# Patient Record
Sex: Female | Born: 2000 | Race: Black or African American | Hispanic: No | Marital: Single | Smoking: Never smoker
Health system: Southern US, Community
[De-identification: ages and names within clinical notes are randomized; demographics above are authoritative.]

## PROBLEM LIST (undated history)

## (undated) DIAGNOSIS — F909 Attention-deficit hyperactivity disorder, unspecified type: Secondary | ICD-10-CM

## (undated) DIAGNOSIS — F419 Anxiety disorder, unspecified: Secondary | ICD-10-CM

## (undated) HISTORY — PX: NO PAST SURGERIES: SHX2092

---
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
## 2020-03-16 ENCOUNTER — Emergency Department (HOSPITAL_COMMUNITY)
Admission: EM | Admit: 2020-03-16 | Discharge: 2020-03-16 | Disposition: A | Discharge status: Home / Self Care | Payer: BC Managed Care – PPO | Attending: Emergency Medicine | Admitting: Emergency Medicine

## 2020-03-16 ENCOUNTER — Encounter (HOSPITAL_COMMUNITY): Payer: Self-pay

## 2020-03-16 ENCOUNTER — Other Ambulatory Visit: Payer: Self-pay

## 2020-03-16 DIAGNOSIS — R1031 Right lower quadrant pain: Secondary | ICD-10-CM | POA: Diagnosis present

## 2020-03-16 DIAGNOSIS — Z5321 Procedure and treatment not carried out due to patient leaving prior to being seen by health care provider: Secondary | ICD-10-CM | POA: Insufficient documentation

## 2020-03-16 NOTE — ED Triage Notes (Signed)
Patient arrived with a sharp pain on her right flank over the last week. Reports managing with otc medication. Declines any other symptoms at this time.

## 2020-03-19 ENCOUNTER — Emergency Department (HOSPITAL_COMMUNITY): Payer: BC Managed Care – PPO

## 2020-03-19 ENCOUNTER — Encounter (HOSPITAL_COMMUNITY): Payer: Self-pay | Admitting: *Deleted

## 2020-03-19 ENCOUNTER — Other Ambulatory Visit: Payer: Self-pay

## 2020-03-19 ENCOUNTER — Ambulatory Visit (HOSPITAL_COMMUNITY): Admit: 2020-03-19 | Discharge status: Against Medical Advice | Payer: No Typology Code available for payment source

## 2020-03-19 DIAGNOSIS — F909 Attention-deficit hyperactivity disorder, unspecified type: Secondary | ICD-10-CM | POA: Insufficient documentation

## 2020-03-19 DIAGNOSIS — R0781 Pleurodynia: Secondary | ICD-10-CM | POA: Insufficient documentation

## 2020-03-19 DIAGNOSIS — Z79899 Other long term (current) drug therapy: Secondary | ICD-10-CM | POA: Diagnosis not present

## 2020-03-19 NOTE — ED Triage Notes (Signed)
Pt reports sharp pain under the right lateral ribs that radiates to the right upper chest. Pain with deep inspiration. Pt denies cough or fevers. Taking OTC meds without relief.

## 2020-03-20 ENCOUNTER — Emergency Department (HOSPITAL_COMMUNITY)
Admission: EM | Admit: 2020-03-20 | Discharge: 2020-03-20 | Disposition: A | Discharge status: Home / Self Care | Payer: BC Managed Care – PPO | Attending: Emergency Medicine | Admitting: Emergency Medicine

## 2020-03-20 DIAGNOSIS — R0781 Pleurodynia: Secondary | ICD-10-CM

## 2020-03-20 HISTORY — DX: Anxiety disorder, unspecified: F41.9

## 2020-03-20 HISTORY — DX: Attention-deficit hyperactivity disorder, unspecified type: F90.9

## 2020-03-20 MED ORDER — METHOCARBAMOL 500 MG PO TABS: 500.0000 mg | ORAL_TABLET | Freq: Two times a day (BID) | ORAL | 0 refills | Status: DC

## 2020-03-20 MED ORDER — LIDOCAINE 5 % EX PTCH: 1.0000 | MEDICATED_PATCH | CUTANEOUS | 0 refills | Status: AC

## 2020-03-20 NOTE — Discharge Instructions (Addendum)
At this time there does not appear to be the presence of an emergent medical condition, however there is always the potential for conditions to change. Please read and follow the below instructions.  Please return to the Emergency Department immediately for any new or worsening symptoms. Please be sure to follow up with your Primary Care Provider within one week regarding your visit today; please call their office to schedule an appointment even if you are feeling better for a follow-up visit. You may use the muscle relaxer Robaxin as prescribed to help with your symptoms.  Do not drive or operate heavy machinery while taking Robaxin as it will make you drowsy.  Do not drink alcohol or take other sedating medications while taking Robaxin as this will worsen side effects. Please take Ibuprofen (Advil, motrin) and Tylenol (acetaminophen) to relieve your pain.  You may take up to 400 MG (2 pills) of normal strength ibuprofen every 8 hours as needed.  In between doses of ibuprofen you make take tylenol, up to 500 mg (one extra strength pill).  Do not take more than 3,000 mg tylenol in a 24 hour period.  Please check all medication labels as many medications such as pain and cold medications may contain tylenol.  Do not drink alcohol while taking these medications.  Do not take other NSAID'S while taking ibuprofen (such as aleve or naproxen).  Please take ibuprofen with food to decrease stomach upset. You may use the Lidoderm patch as prescribed to help with your symptoms.  Lidoderm may be expensive so you may speak with your pharmacist about finding over-the-counter medications that work similarly such as Salon patches.  Get help right away if: Your chest pain is worse. You have a cough that gets worse, or you cough up blood. You have very bad (severe) pain in your belly (abdomen). You pass out (faint). You have either of these for no clear reason: Sudden chest discomfort. Sudden discomfort in your arms,  back, neck, or jaw. You have shortness of breath at any time. You suddenly start to sweat, or your skin gets clammy. You feel sick to your stomach (nauseous). You throw up (vomit). You suddenly feel lightheaded or dizzy. You feel very weak or tired. Your heart starts to beat fast, or it feels like it is skipping beats. You have any new/concerning or worsening of symptoms These symptoms may be an emergency. Do not wait to see if the symptoms will go away. Get medical help right away. Call your local emergency services (911 in the [place] Do not drive yourself to the hospital.  Please read the additional information packets attached to your discharge summary.  Do not take your medicine if  develop an itchy rash, swelling in your mouth or lips, or difficulty breathing; call 911 and seek immediate emergency medical attention if this occurs.  You may review your lab tests and imaging results in their entirety on your MyChart account.  Please discuss all results of fully with your primary care provider and other specialist at your follow-up visit.  Note: Portions of this text may have been transcribed using voice recognition software. Every effort was made to ensure accuracy; however, inadvertent computerized transcription errors may still be present.

## 2020-03-20 NOTE — ED Provider Notes (Signed)
[facility]-EMERGENCY DEPT Provider Note   CSN: [id number] Arrival date & time: 03/19/20  2224     History Chief Complaint  Patient presents with  . Rib Pain    Audrey Allen is a 19 y.o. female otherwise healthy, medications include Vyvanse and Nexplanon implant.  Patient presents today for right lower rib pain onset 2 weeks ago pain is sharp intermittent no clear inciting event, pain is occasionally worsened with movement and occasionally with deep breathing, pain improved with putting pressure on her lower ribs with her hand, pain currently mild intensity, when pain is more severe it will radiate up to her upper right ribs.  She denies fall/injury, fever/chills, headache, cough/hemoptysis, shortness of breath, abdominal pain, nausea/vomiting, extremity swelling/color change, history of blood clot, recent immobilization/surgery, history of cancer or any additional concerns.  Pain is not exacerbated with eating.  HPI     Past Medical History:  Diagnosis Date  . ADHD   . Anxiety     There are no problems to display for this patient.   No past surgical history on file.   OB History   No obstetric history on file.     No family history on file.  Social History   Tobacco Use  . Smoking status: Never Smoker  . Smokeless tobacco: Never Used  Substance Use Topics  . Alcohol use: Never  . Drug use: Never    Home Medications Prior to Admission medications   Medication Sig Start Date End Date Taking? Authorizing Provider  lidocaine (LIDODERM) 5 % Place 1 patch onto the skin daily. Remove & Discard patch within 12 hours or as directed by MD 03/20/20   Bill Salinas, PA-C  methocarbamol (ROBAXIN) 500 MG tablet Take 1 tablet (500 mg total) by mouth 2 (two) times daily. 03/20/20   Bill Salinas, PA-C    Allergies    Patient has no known allergies.  Review of Systems   Review of Systems  Constitutional: Negative.  Negative for chills and  fever.  Respiratory: Negative.  Negative for cough and shortness of breath.   Cardiovascular: Positive for chest pain. Negative for leg swelling.  Gastrointestinal: Negative.  Negative for abdominal pain, diarrhea, nausea and vomiting.  Musculoskeletal: Negative.  Negative for back pain.    Physical Exam Updated Vital Signs BP 103/66   Pulse 62   Temp 99.4 F (37.4 C) (Oral)   Resp 17   LMP 03/04/2020   SpO2 100%   Physical Exam Constitutional:      General: She is not in acute distress.    Appearance: Normal appearance. She is well-developed. She is not ill-appearing or diaphoretic.  HENT:     Head: Normocephalic and atraumatic.  Eyes:     General: Vision grossly intact. Gaze aligned appropriately.     Pupils: Pupils are equal, round, and reactive to light.  Neck:     Trachea: Trachea and phonation normal.  Cardiovascular:     Rate and Rhythm: Normal rate and regular rhythm.     Heart sounds: Normal heart sounds.  Pulmonary:     Effort: Pulmonary effort is normal. No accessory muscle usage or respiratory distress.     Breath sounds: Normal breath sounds and air entry.  Chest:     Chest wall: Tenderness present. No deformity or crepitus.    Abdominal:     General: There is no distension.     Palpations: Abdomen is soft.     Tenderness: There is no  abdominal tenderness. There is no guarding or rebound. Negative signs include Murphy's sign.  Musculoskeletal:        General: Normal range of motion.     Cervical back: Normal range of motion.     Right lower leg: No edema.     Left lower leg: No edema.  Skin:    General: Skin is warm and dry.     Findings: No rash.  Neurological:     Mental Status: She is alert.     GCS: GCS eye subscore is 4. GCS verbal subscore is 5. GCS motor subscore is 6.     Comments: Speech is clear and goal oriented, follows commands Major Cranial nerves without deficit, no facial droop Moves extremities without ataxia, coordination intact    Psychiatric:        Behavior: Behavior normal.     ED Results / Procedures / Treatments   Labs (all labs ordered are listed, but only abnormal results are displayed) Labs Reviewed - No data to display  EKG EKG Interpretation  Date/Time:  Friday March 20 2020 05:30:12 EDT Ventricular Rate:  57 PR Interval:    QRS Duration: 89 QT Interval:  400 QTC Calculation: 390 R Axis:   78 Text Interpretation: Sinus rhythm Atrial premature complex No significant change since last tracing Confirmed by Zadie Rhine ([id number]) on 03/20/2020 5:40:38 AM   Radiology DG Chest 2 View  Result Date: 03/19/2020 CLINICAL DATA:  Right-sided chest pain EXAM: CHEST - 2 VIEW COMPARISON:  None. FINDINGS: The heart size and mediastinal contours are within normal limits. Both lungs are clear. The visualized skeletal structures are unremarkable. IMPRESSION: No active cardiopulmonary disease. Electronically Signed   By: Alcide Clever M.D.   On: 03/19/2020 23:32    Procedures Procedures (including critical care time)  Medications Ordered in ED Medications - No data to display  ED Course  I have reviewed the triage vital signs and the nursing notes.  Pertinent labs & imaging results that were available during my care of the patient were reviewed by me and considered in my medical decision making (see chart for details).    MDM Rules/Calculators/A&P                          Additional history obtained from: 1. Nursing notes from this visit. ------------------------ CXR:  IMPRESSION:  No active cardiopulmonary disease.   EKG: Sinus rhythm Atrial premature complex No significant change since last tracing Confirmed by Zadie Rhine ([id number]) on 03/20/2020 5:40:38 AM  Patient ambulated without difficulty no tachycardia or hypoxia on room air. - Patient's pain is reproducible and is localized to the right lower ribs, no evidence of fracture.  Suspect musculoskeletal etiology likely costochondritis. Low  risk by Anner Crete' criteria. Based on history and presentation doubt ACS, PE, pneumonia, dissection, pneumothorax or other emergent pathologies at this time.  No evidence of rash to suggest shingles.  No right upper quadrant tenderness nausea vomiting or postprandial pain to suggest gallbladder or other intraabdominal etiology.  Patient overall well-appearing and in no acute distress.  Will treat with anti-inflammatories, muscle relaxers and Lidoderm patch.  Patient encouraged to see her primary care doctor next week for recheck.  At this time there does not appear to be any evidence of an acute emergency medical condition and the patient appears stable for discharge with appropriate outpatient follow up. Diagnosis was discussed with patient who verbalizes understanding of care plan and is agreeable to discharge.  I have discussed return precautions with patient who verbalizes understanding. Patient encouraged to follow-up with their PCP. All questions answered.  Patient's case discussed with Dr. Bebe Shaggy who agrees with work-up and plan to discharge with follow-up.   Note: Portions of this report may have been transcribed using voice recognition software. Every effort was made to ensure accuracy; however, inadvertent computerized transcription errors may still be present. Final Clinical Impression(s) / ED Diagnoses Final diagnoses:  Rib pain on right side    Rx / DC Orders ED Discharge Orders         Ordered    methocarbamol (ROBAXIN) 500 MG tablet  2 times daily        03/20/20 0616    lidocaine (LIDODERM) 5 %  Every 24 hours        03/20/20 0616           Bill Salinas, PA-C 03/20/20 6122    Zadie Rhine, MD 03/20/20 [phone]

## 2020-03-20 NOTE — ED Notes (Signed)
Pulse Ox while ambulating- pt assisted OOB to ambulate in/around room. Pulse measurement 74-78bpm; Ox measurement 95-98% RA. Riki Rusk RN advised. [employer]

## 2020-06-01 ENCOUNTER — Encounter (HOSPITAL_COMMUNITY): Payer: Self-pay

## 2020-06-01 ENCOUNTER — Emergency Department (HOSPITAL_COMMUNITY)
Admission: EM | Admit: 2020-06-01 | Discharge: 2020-06-01 | Disposition: A | Discharge status: Home / Self Care | Payer: BC Managed Care – PPO | Attending: Emergency Medicine | Admitting: Emergency Medicine

## 2020-06-01 ENCOUNTER — Other Ambulatory Visit: Payer: Self-pay

## 2020-06-01 ENCOUNTER — Emergency Department: Admit: 2020-06-01 | Discharge status: Absent without leave | Payer: Self-pay

## 2020-06-01 DIAGNOSIS — Z20822 Contact with and (suspected) exposure to covid-19: Secondary | ICD-10-CM | POA: Insufficient documentation

## 2020-06-01 DIAGNOSIS — R112 Nausea with vomiting, unspecified: Secondary | ICD-10-CM | POA: Diagnosis present

## 2020-06-01 DIAGNOSIS — J039 Acute tonsillitis, unspecified: Secondary | ICD-10-CM | POA: Diagnosis not present

## 2020-06-01 LAB — COMPREHENSIVE METABOLIC PANEL
ALT: 34 U/L (ref 0–44)
AST: 26 U/L (ref 15–41)
Albumin: 4.4 g/dL (ref 3.5–5.0)
Alkaline Phosphatase: 47 U/L (ref 38–126)
Anion gap: 10 (ref 5–15)
BUN: 14 mg/dL (ref 6–20)
CO2: 24 mmol/L (ref 22–32)
Calcium: 9.3 mg/dL (ref 8.9–10.3)
Chloride: 105 mmol/L (ref 98–111)
Creatinine, Ser: 1.05 mg/dL — ABNORMAL HIGH (ref 0.44–1.00)
GFR, Estimated: >60 mL/min (ref >60)
Glucose, Bld: 103 mg/dL — ABNORMAL HIGH (ref 70–99)
Potassium: 3.7 mmol/L (ref 3.5–5.1)
Sodium: 139 mmol/L (ref 135–145)
Total Bilirubin: 0.7 mg/dL (ref 0.3–1.2)
Total Protein: 7.9 g/dL (ref 6.5–8.1)

## 2020-06-01 LAB — CBC
HCT: 40.4 % (ref 36.0–46.0)
Hemoglobin: 13.4 g/dL (ref 12.0–15.0)
MCH: 29.5 pg (ref 26.0–34.0)
MCHC: 33.2 g/dL (ref 30.0–36.0)
MCV: 88.8 fL (ref 80.0–100.0)
Platelets: 301 10*3/uL (ref 150–400)
RBC: 4.55 MIL/uL (ref 3.87–5.11)
RDW: 12.5 % (ref 11.5–15.5)
WBC: 7.5 10*3/uL (ref 4.0–10.5)
nRBC: 0 % (ref 0.0–0.2)

## 2020-06-01 LAB — URINALYSIS, ROUTINE W REFLEX MICROSCOPIC
Bacteria, UA: NONE SEEN
Bilirubin Urine: NEGATIVE
Glucose, UA: NEGATIVE mg/dL
Hgb urine dipstick: NEGATIVE
Ketones, ur: 5 mg/dL — AB
Nitrite: NEGATIVE
Protein, ur: 100 mg/dL — AB
Specific Gravity, Urine: 1.018 (ref 1.005–1.030)
pH: 5 (ref 5.0–8.0)

## 2020-06-01 LAB — LIPASE, BLOOD: Lipase: 27 U/L (ref 11–51)

## 2020-06-01 LAB — GROUP A STREP BY PCR: Group A Strep by PCR: NOT DETECTED

## 2020-06-01 LAB — I-STAT BETA HCG BLOOD, ED (MC, WL, AP ONLY): I-stat hCG, quantitative: <5 m[IU]/mL (ref <5)

## 2020-06-01 LAB — RESPIRATORY PANEL BY RT PCR (FLU A&B, COVID)
Influenza A by PCR: NEGATIVE
Influenza B by PCR: NEGATIVE
SARS Coronavirus 2 by RT PCR: NEGATIVE

## 2020-06-01 MED ORDER — DEXAMETHASONE SODIUM PHOSPHATE 10 MG/ML IJ SOLN
10.0000 mg | Freq: Once | INTRAMUSCULAR | Status: AC
  Administered 2020-06-01: 10 mg via INTRAVENOUS
  Filled 2020-06-01: qty 1

## 2020-06-01 MED ORDER — CLINDAMYCIN PHOSPHATE 300 MG/50ML IV SOLN
300.0000 mg | Freq: Once | INTRAVENOUS | Status: AC
  Administered 2020-06-01: 300 mg via INTRAVENOUS
  Filled 2020-06-01: qty 50

## 2020-06-01 MED ORDER — CLINDAMYCIN HCL 150 MG PO CAPS: 300.0000 mg | ORAL_CAPSULE | Freq: Three times a day (TID) | ORAL | 0 refills | Status: AC

## 2020-06-01 MED ORDER — KETOROLAC TROMETHAMINE 15 MG/ML IJ SOLN
15.0000 mg | Freq: Once | INTRAMUSCULAR | Status: AC
  Administered 2020-06-01: 15 mg via INTRAVENOUS
  Filled 2020-06-01: qty 1

## 2020-06-01 MED ORDER — ONDANSETRON HCL 4 MG PO TABS: 4.0000 mg | ORAL_TABLET | Freq: Three times a day (TID) | ORAL | 0 refills | Status: AC | PRN

## 2020-06-01 MED ORDER — SODIUM CHLORIDE 0.9 % IV BOLUS
1000.0000 mL | Freq: Once | INTRAVENOUS | Status: AC
  Administered 2020-06-01: 1000 mL via INTRAVENOUS

## 2020-06-01 NOTE — ED Notes (Signed)
An After Visit Summary was printed and given to the patient. Discharge instructions given and no further questions at this time.  

## 2020-06-01 NOTE — Discharge Instructions (Signed)
Take antibiotics as prescribed.  Take entire course, even if your symptoms improve. Use Zofran as needed for nausea or vomiting. Use ibuprofen, up to 3 times a day with meals, to help with pain and swelling. Make sure stay well-hydrated with water. Follow-up with ear nose and throat doctor listed below if your symptoms are not improving. Return to the emergency room if you develop high fevers, inability to open up your mouth, inability to swallow your own spit, or any new, worsening, or concerning symptoms.

## 2020-06-01 NOTE — ED Notes (Addendum)
Pt ambulated to restroom. 

## 2020-06-01 NOTE — ED Provider Notes (Signed)
[facility]-EMERGENCY DEPT Provider Note   CSN: [id number] Arrival date & time: 06/01/20  1811     History Chief Complaint  Patient presents with  . Nausea  . Emesis  . Sore Throat    Audrey Allen is a 19 y.o. female presenting for evaluation of nausea, vomiting, sore throat.  Patient states her symptoms began a few days ago.  She reports nausea and vomiting, although vomiting has resolved over the past several days.  She reports since then, she has had right-sided throat pain.  She reports her voice is raspy, but not muffled.  She denies fevers or chills.  No cough, abdominal pain, urinary symptoms, normal bowel movements.  No history of similar.  She denies sick contacts.  She has received her Covid vaccine.  She reports a history of asthma, anxiety, ADHD.  She has not taken anything for her symptoms, nothing makes it better or worse.  HPI     Past Medical History:  Diagnosis Date  . ADHD   . Anxiety     There are no problems to display for this patient.   History reviewed. No pertinent surgical history.   OB History   No obstetric history on file.     History reviewed. No pertinent family history.  Social History   Tobacco Use  . Smoking status: Never Smoker  . Smokeless tobacco: Never Used  Substance Use Topics  . Alcohol use: Never  . Drug use: Never    Home Medications Prior to Admission medications   Medication Sig Start Date End Date Taking? Authorizing Provider  clindamycin (CLEOCIN) 150 MG capsule Take 2 capsules (300 mg total) by mouth 3 (three) times daily for 7 days. 06/01/20 06/08/20  Khanh Cordner, PA-C  lidocaine (LIDODERM) 5 % Place 1 patch onto the skin daily. Remove & Discard patch within 12 hours or as directed by MD 03/20/20   Bill Salinas, PA-C  methocarbamol (ROBAXIN) 500 MG tablet Take 1 tablet (500 mg total) by mouth 2 (two) times daily. 03/20/20   Harlene Salts A, PA-C  ondansetron (ZOFRAN) 4 MG tablet  Take 1 tablet (4 mg total) by mouth every 8 (eight) hours as needed for nausea or vomiting. 06/01/20   Mahalie Kanner, PA-C    Allergies    Patient has no known allergies.  Review of Systems   Review of Systems  HENT: Positive for sore throat.   Gastrointestinal: Positive for nausea and vomiting.  All other systems reviewed and are negative.   Physical Exam Updated Vital Signs BP 121/89   Pulse 80   Temp 99 F (37.2 C) (Oral)   Resp 18   Ht 5\' 5"  (1.651 m)   Wt 81.6 kg   SpO2 99%   BMI 29.95 kg/m   Physical Exam Vitals and nursing note reviewed.  Constitutional:      General: She is not in acute distress.    Appearance: She is well-developed.     Comments: Appears nontoxic  HENT:     Head: Normocephalic and atraumatic.     Comments: OP mildly erythematous with right-sided tonsillar swelling without exudate.  Uvula midline.  Patient with slightly muffled voice, but no trismus.  Handling secretions easily. Eyes:     Conjunctiva/sclera: Conjunctivae normal.     Pupils: Pupils are equal, round, and reactive to light.  Cardiovascular:     Rate and Rhythm: Normal rate and regular rhythm.     Pulses: Normal pulses.  Pulmonary:  Effort: Pulmonary effort is normal. No respiratory distress.     Breath sounds: Normal breath sounds. No wheezing.  Abdominal:     General: There is no distension.     Palpations: Abdomen is soft. There is no mass.     Tenderness: There is no abdominal tenderness. There is no guarding or rebound.  Musculoskeletal:        General: Normal range of motion.     Cervical back: Normal range of motion and neck supple.  Skin:    General: Skin is warm and dry.     Capillary Refill: Capillary refill takes less than 2 seconds.  Neurological:     Mental Status: She is alert and oriented to person, place, and time.     ED Results / Procedures / Treatments   Labs (all labs ordered are listed, but only abnormal results are displayed) Labs Reviewed    COMPREHENSIVE METABOLIC PANEL - Abnormal; Notable for the following components:      Result Value   Glucose, Bld 103 (*)    Creatinine, Ser 1.05 (*)    All other components within normal limits  GROUP A STREP BY PCR  RESPIRATORY PANEL BY RT PCR (FLU A&B, COVID)  LIPASE, BLOOD  CBC  URINALYSIS, ROUTINE W REFLEX MICROSCOPIC  I-STAT BETA HCG BLOOD, ED ([facility], [facility], [facility] ONLY)    EKG None  Radiology No results found.  Procedures Procedures (including critical care time)  Medications Ordered in ED Medications  dexamethasone (DECADRON) injection 10 mg (10 mg Intravenous Given 06/01/20 2040)  ketorolac (TORADOL) 15 MG/ML injection 15 mg (15 mg Intravenous Given 06/01/20 2039)  sodium chloride 0.9 % bolus 1,000 mL (0 mLs Intravenous Stopped 06/01/20 2126)  clindamycin (CLEOCIN) IVPB 300 mg (0 mg Intravenous Stopped 06/01/20 2126)    ED Course  I have reviewed the triage vital signs and the nursing notes.  Pertinent labs & imaging results that were available during my care of the patient were reviewed by me and considered in my medical decision making (see chart for details).    MDM Rules/Calculators/A&P                          Patient presenting for evaluation of sore throat, nausea, vomiting.  On exam, patient appears nontoxic.  She does have a slightly muffled voice and unilateral tonsillar swelling.  However no trismus, uvula is midline, no fevers, and she is handling secretions easily.  Consider tonsillitis/early PTA, however do not believe she needs emergent drainage in the ED.  As such, will obtain CT scan.  Labs obtained from triage interpreted by me, shows mild dehydration with creatinine 1.05, but otherwise reassuring.  Will order strep, Covid, treat symptomatically, reassess.  Strep negative.  On reassessment, patient reports symptoms are much improved.  She has tolerated fluids without difficulty.  Voice sounds less muffled.  Discussed treatment at home, and close follow-up with  ENT.  At this time, patient appears safe for discharge.  Return precautions given.  Patient states she understands and agrees to plan.  Final Clinical Impression(s) / ED Diagnoses Final diagnoses:  Tonsillitis    Rx / DC Orders ED Discharge Orders         Ordered    clindamycin (CLEOCIN) 150 MG capsule  3 times daily        06/01/20 2143    ondansetron (ZOFRAN) 4 MG tablet  Every 8 hours PRN  06/01/20 2143           Alveria Apley, PA-C 06/01/20 2146    Derwood Kaplan, MD 06/02/20 1614

## 2020-06-01 NOTE — ED Triage Notes (Signed)
Pt c/o back pain, nausea, and sore throat. Pt has had N/V x3 days.

## 2020-07-25 ENCOUNTER — Other Ambulatory Visit: Payer: Self-pay

## 2020-07-25 ENCOUNTER — Ambulatory Visit
Admission: EM | Admit: 2020-07-25 | Discharge: 2020-07-25 | Disposition: A | Discharge status: Home / Self Care | Payer: BC Managed Care – PPO

## 2020-07-25 DIAGNOSIS — L0291 Cutaneous abscess, unspecified: Secondary | ICD-10-CM | POA: Diagnosis not present

## 2020-07-25 NOTE — ED Triage Notes (Signed)
Pt ids here with abscess that is on her right thigh that started a week ago, pt has not taken anything to relieve discomfort.

## 2020-07-25 NOTE — ED Provider Notes (Signed)
[facility]    CSN: [id number] Arrival date & time: 07/25/20  1302      History   Chief Complaint Chief Complaint  Patient presents with  . Abscess    HPI Audrey Allen is a 20 y.o. female  Present for abscess to right upper, inner thigh.  States it began about a week ago.  Using tea tree oil, hot compresses without relief.  Denies immunocompromised state.  Past Medical History:  Diagnosis Date  . ADHD   . Anxiety     There are no problems to display for this patient.   History reviewed. No pertinent surgical history.  OB History   No obstetric history on file.      Home Medications    Prior to Admission medications   Medication Sig Start Date End Date Taking? Authorizing Provider  albuterol (VENTOLIN HFA) 108 (90 Base) MCG/ACT inhaler Inhale into the lungs. 04/26/18  Yes [provider]  EPINEPHrine 0.3 mg/0.3 mL IJ SOAJ injection Inject into the muscle. 07/03/19  Yes [provider]  escitalopram (LEXAPRO) 10 MG tablet Take by mouth. 12/19/19  Yes [provider]  lisdexamfetamine (VYVANSE) 20 MG capsule  09/29/15  Yes [provider]  lidocaine (LIDODERM) 5 % Place 1 patch onto the skin daily. Remove & Discard patch within 12 hours or as directed by MD 03/20/20   Bill Salinas, PA-C  methocarbamol (ROBAXIN) 500 MG tablet Take 1 tablet (500 mg total) by mouth 2 (two) times daily. 03/20/20   Harlene Salts A, PA-C  ondansetron (ZOFRAN) 4 MG tablet Take 1 tablet (4 mg total) by mouth every 8 (eight) hours as needed for nausea or vomiting. 06/01/20   Caccavale, Sophia, PA-C    Family History Family History  Problem Relation Age of Onset  . Healthy Mother   . Healthy Father     Social History Social History   Tobacco Use  . Smoking status: Never Smoker  . Smokeless tobacco: Never Used  Vaping Use  . Vaping Use: Every day  Substance Use Topics  . Alcohol use: Yes     Allergies   Other   Review of  Systems Review of Systems  Constitutional: Negative for fatigue and fever.  HENT: Negative for ear pain, sinus pain, sore throat and voice change.   Eyes: Negative for pain, redness and visual disturbance.  Respiratory: Negative for cough and shortness of breath.   Cardiovascular: Negative for chest pain and palpitations.  Gastrointestinal: Negative for abdominal pain, diarrhea and vomiting.  Musculoskeletal: Negative for arthralgias and myalgias.  Skin: Positive for wound. Negative for rash.  Neurological: Negative for syncope and headaches.     Physical Exam Triage Vital Signs ED Triage Vitals  Enc Vitals Group     BP 07/25/20 1449 108/71     Pulse Rate 07/25/20 1449 91     Resp 07/25/20 1449 20     Temp 07/25/20 1449 98.5 F (36.9 C)     Temp Source 07/25/20 1343 Oral     SpO2 07/25/20 1449 99 %     Weight --      Height --      Head Circumference --      Peak Flow --      Pain Score 07/25/20 1340 0     Pain Loc --      Pain Edu? --      Excl. in GC? --    No data found.  Updated Vital Signs BP  108/71 (BP Location: Left Arm)   Pulse 91   Temp 98.5 F (36.9 C) (Oral)   Resp 20   LMP 07/25/2020   SpO2 99%   Visual Acuity Right Eye Distance:   Left Eye Distance:   Bilateral Distance:    Right Eye Near:   Left Eye Near:    Bilateral Near:     Physical Exam Constitutional:      General: She is not in acute distress. HENT:     Head: Normocephalic and atraumatic.  Eyes:     General: No scleral icterus.    Pupils: Pupils are equal, round, and reactive to light.  Cardiovascular:     Rate and Rhythm: Normal rate.  Pulmonary:     Effort: Pulmonary effort is normal.  Skin:    Coloration: Skin is not jaundiced or pale.     Comments: 2 cm fluctuant abscess to right upper inner thigh.  No active discharge.  Neurological:     Mental Status: She is alert and oriented to person, place, and time.      UC Treatments / Results  Labs (all labs ordered are  listed, but only abnormal results are displayed) Labs Reviewed - No data to display  EKG   Radiology No results found.  Procedures Incision and Drainage  Date/Time: 07/25/2020 3:38 PM Performed by: Shea Evans, PA-C Authorized by: Shea Evans, PA-C   Consent:    Consent obtained:  Verbal   Consent given by:  Patient   Risks, benefits, and alternatives were discussed: yes     Risks discussed:  Bleeding, incomplete drainage, pain and damage to other organs   Alternatives discussed:  No treatment Universal protocol:    Patient identity confirmed:  Verbally with patient Location:    Type:  Abscess   Size:  2 cm   Location:  Lower extremity   Lower extremity location:  Leg   Leg location:  R upper leg Pre-procedure details:    Skin preparation:  Betadine Sedation:    Sedation type:  None Anesthesia:    Anesthesia method:  Local infiltration   Local anesthetic:  Lidocaine 2% w/o epi Procedure type:    Complexity:  Simple Procedure details:    Ultrasound guidance: no     Needle aspiration: no     Incision types:  Single straight   Incision depth:  Subcutaneous   Scalpel blade:  11   Wound management:  Probed and deloculated, irrigated with saline and extensive cleaning   Drainage:  Purulent and bloody   Drainage amount:  Copious   Wound treatment:  Wound left open   Packing materials:  None Post-procedure details:    Procedure completion:  Tolerated well, no immediate complications   (including critical care time)  Medications Ordered in UC Medications - No data to display  Initial Impression / Assessment and Plan / UC Course  I have reviewed the triage vital signs and the nursing notes.  Pertinent labs & imaging results that were available during my care of the patient were reviewed by me and considered in my medical decision making (see chart for details).     I&D performed: Patient tolerated well.  Return precautions discussed, pt  verbalized understanding and is agreeable to plan. Final Clinical Impressions(s) / UC Diagnoses   Final diagnoses:  Abscess     Discharge Instructions     Keep area(s) clean and dry. Apply hot compress / towel for 5-10 minutes 3-5 times daily. Return for worsening pain,  redness, swelling, discharge, fever.  Helpful prevention tips: Keep nails short to avoid secondary skin infections. Use new, clean razors when shaving. Avoid antiperspirants - look for deodorants without aluminum. Avoid wearing underwire bras as this can irritate the area further.     ED Prescriptions    None     PDMP not reviewed this encounter.   Hall[place]Potvin, [place], [place] 07/25/20 1539

## 2020-07-25 NOTE — Discharge Instructions (Signed)
Keep area(s) clean and dry. Apply hot compress / towel for 5-10 minutes 3-5 times daily. Return for worsening pain, redness, swelling, discharge, fever.  Helpful prevention tips: Keep nails short to avoid secondary skin infections. Use new, clean razors when shaving. Avoid antiperspirants - look for deodorants without aluminum. Avoid wearing underwire bras as this can irritate the area further.  

## 2020-08-13 ENCOUNTER — Encounter: Payer: Self-pay | Admitting: *Deleted

## 2020-08-14 ENCOUNTER — Ambulatory Visit: Payer: BC Managed Care – PPO | Admitting: Neurology

## 2020-08-14 ENCOUNTER — Encounter: Payer: Self-pay | Admitting: Neurology

## 2020-08-14 VITALS — BP 116/82 | HR 81 | Ht 65.0 in | Wt 163.0 lb

## 2020-08-14 DIAGNOSIS — R55 Syncope and collapse: Secondary | ICD-10-CM

## 2020-08-14 NOTE — Patient Instructions (Addendum)
Recommend cardiac evaluation with Dr. Darrick Huntsman Increase fluids and salt MRI of the brain w/wo contrast EEG Follow up   Syncope  Syncope refers to a condition in which a person temporarily loses consciousness. Syncope may also be called fainting or passing out. It is caused by a sudden decrease in blood flow to the brain. Even though most causes of syncope are not dangerous, syncope can be a sign of a serious medical problem. Your health care provider may do tests to find the reason why you are having syncope. Signs that you may be about to faint include:  Feeling dizzy or light-headed.  Feeling nauseous.  Seeing all white or all black in your field of vision.  Having cold, clammy skin. If you faint, get medical help right away. Call your local emergency services (911 in the [place] Do not drive yourself to the hospital. Follow these instructions at home: Pay attention to any changes in your symptoms. Take these actions to stay safe and to help relieve your symptoms: Lifestyle  Do not drive, use machinery, or play sports until your health care provider says it is okay.  Do not drink alcohol.  Do not use any products that contain nicotine or tobacco, such as cigarettes and e-cigarettes. If you need help quitting, ask your health care provider.  Drink enough fluid to keep your urine pale yellow. General instructions  Take over-the-counter and prescription medicines only as told by your health care provider.  If you are taking blood pressure or heart medicine, get up slowly and take several minutes to sit and then stand. This can reduce dizziness or light-headedness.  Have someone stay with you until you feel stable.  If you start to feel like you might faint, lie down right away and raise (elevate) your feet above the level of your heart. Breathe deeply and steadily. Wait until all the symptoms have passed.  Keep all follow-up visits as told by your health care provider. This is  important. Get help right away if you:  Have a severe headache.  Faint once or repeatedly.  Have pain in your chest, abdomen, or back.  Have a very fast or irregular heartbeat (palpitations).  Have pain when you breathe.  Are bleeding from your mouth or rectum, or you have black or tarry stool.  Have a seizure.  Are confused.  Have trouble walking.  Have severe weakness.  Have vision problems. These symptoms may represent a serious problem that is an emergency. Do not wait to see if your symptoms will go away. Get medical help right away. Call your local emergency services (911 in the [place] Do not drive yourself to the hospital. Summary  Syncope refers to a condition in which a person temporarily loses consciousness. It is caused by a sudden decrease in blood flow to the brain.  Signs that you may be about to faint include dizziness, feeling light-headed, feeling nauseous, sudden vision changes, or cold, clammy skin.  Although most causes of syncope are not dangerous, syncope can be a sign of a serious medical problem. If you faint, get medical help right away. This information is not intended to replace advice given to you by your health care provider. Make sure you discuss any questions you have with your health care provider. Document Revised: 11/21/2019 Document Reviewed: 11/21/2019 Elsevier Patient Education  2021 [employer].

## 2020-08-14 NOTE — Progress Notes (Signed)
[facility]    Provider:  Dr Lucia Gaskins Requesting Provider: Maryelizabeth Rowan MD Primary Care Provider:  Rosie Fate, MD  CC:  Episodes of Loss of ocnsciousness  HPI:  Audrey Allen is a 20 y.o. female here as requested by Maryelizabeth Rowan MD for episodes of loss of consciousness for a month and a half.  She has a past medical history of ADD, anxiety,  back pain, loss of consciousness.  I reviewed Dr. Salvadore Dom notes that stated that she has had 4 episodes that she can recall, started 1.5 months ago, first occurred when she was standing with her boyfriend he hugged her and she felt hot and dizzy, realizes she should sit down, she physically cannot speak or move then she came to she was on the ground, he said she went limp and he lowered her to the ground.  She had a similar episode while sitting in her car in the parking lot in the way to work, same symptoms, also occurred with her girlfriend while in her house while walking, girlfriend thought she tripped, same symptoms, the fourth time was when she was in the shower, same symptoms and she came to seated in a shower against the edge of the tub, does not occur when under stress, not related to food, sleep, no alcohol or drug use, occurred before starting Vyvanse.  She is a current some day smoker.  Over the last several months she is also been to the ED multiple times for COVID, nausea, sore throat, and abscess in her thigh, corresponding to the time she says that she was having loss of consciousness.  Started 1.5 months ago, last 3 days ago. She is standing and walking around, she gets super hot, dizzy, lightheaded, feels like she sit down, never gets to the point where she can sit down. She loses consciousness, very brief, no shaking or tongue biting, no seizure activity, she says for 10-15 seconds she is a little confused on where she it but 15 seconds later she clicks right back. Has happened 4 times. Never when sitting down always  when standing or walking. The first she got it she was evicted, she has been stressed, not having good intake especially fluids. She has also had illnesses in the last 1.5 months, colds, absess, has been to the ED several times with stress. No other focal neurologic deficits, associated symptoms, inciting events or modifiable factors.   Reviewed notes, labs and imaging from outside physicians, which showed:  Cbc/cmp unremarkable 06/01/2020  Review of Systems: Patient complains of symptoms per HPI as well as the following symptoms: passing out. Pertinent negatives and positives per HPI. All others negative.   Social History   Socioeconomic History  . Marital status: Single    Spouse name: Not on file  . Number of children: Not on file  . Years of education: Not on file  . Highest education level: Not on file  Occupational History  . Not on file  Tobacco Use  . Smoking status: Never Smoker  . Smokeless tobacco: Never Used  Vaping Use  . Vaping Use: Every day  Substance and Sexual Activity  . Alcohol use: Yes  . Drug use: Not on file  . Sexual activity: Yes    Birth control/protection: Implant  Other Topics Concern  . Not on file  Social History Narrative  . Not on file   Social Determinants of Health   Financial Resource Strain: Not on file  Food Insecurity: Not on file  Transportation Needs: Not on file  Physical Activity: Not on file  Stress: Not on file  Social Connections: Not on file  Intimate Partner Violence: Not on file    Family History  Problem Relation Age of Onset  . Healthy Mother   . Healthy Father   . Seizures Neg Hx     Past Medical History:  Diagnosis Date  . ADHD   . Anxiety     Patient Active Problem List   Diagnosis Date Noted  . 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 NO PAST SURGERIES      Current Outpatient Medications  Medication Sig Dispense Refill   . albuterol (VENTOLIN HFA) 108 (90 Base) MCG/ACT inhaler Inhale into the lungs.    Marland Kitchen EPINEPHrine 0.3 mg/0.3 mL IJ SOAJ injection Inject into the muscle.    . escitalopram (LEXAPRO) 10 MG tablet Take by mouth.    . lidocaine (LIDODERM) 5 % Place 1 patch onto the skin daily. Remove & Discard patch within 12 hours or as directed by MD 15 patch 0  . lisdexamfetamine (VYVANSE) 20 MG capsule     . ondansetron (ZOFRAN) 4 MG tablet Take 1 tablet (4 mg total) by mouth every 8 (eight) hours as needed for nausea or vomiting. 12 tablet 0   No current facility-administered medications for this visit.    Allergies as of 08/14/2020 - Review Complete 08/14/2020  Allergen Reaction Noted  . Other Swelling and Other (See Comments) 05/19/2014    Vitals: BP 116/82   Pulse 81   Ht 5\' 5"  (1.651 m)   Wt 163 lb (73.9 kg)   LMP 07/25/2020   BMI 27.12 kg/m  Last Weight:  Wt Readings from Last 1 Encounters:  08/14/20 163 lb (73.9 kg) (89 %, Z= 1.22)*   * Growth percentiles are based on CDC (Girls, 2-20 Years) data.   Last Height:   Ht Readings from Last 1 Encounters:  08/14/20 5\' 5"  (1.651 m) (61 %, Z= 0.28)*   * Growth percentiles are based on CDC (Girls, 2-20 Years) data.     Physical exam: Exam: Gen: NAD, conversant, well nourised, well groomed                     CV: RRR, no MRG. No Carotid Bruits. No peripheral edema, warm, nontender Eyes: Conjunctivae clear without exudates or hemorrhage  Neuro: Detailed Neurologic Exam  Speech:    Speech is normal; fluent and spontaneous with normal comprehension.  Cognition:    The patient is oriented to person, place, and time;     recent and remote memory intact;     language fluent;     normal attention, concentration,     fund of knowledge Cranial Nerves:    The pupils are equal, round, and reactive to light. The fundi are normal and spontaneous venous pulsations are present. Visual fields are full to finger confrontation. Extraocular  movements are intact. Trigeminal sensation is intact and the muscles of mastication are normal. The face is symmetric. The palate elevates in the midline. Hearing intact. Voice is normal. Shoulder shrug is normal. The tongue has normal motion without fasciculations.   Coordination:    Normal finger to nose  Gait:    Heel-toe and tandem gait are normal.   Motor Observation:    No asymmetry, no atrophy, and no involuntary movements noted. Tone:    Normal muscle tone.  Posture:    Posture is normal. normal erect    Strength:    Strength is V/V in the upper and lower limbs.      Sensation: intact to LT     Reflex Exam:  DTR's:    Deep tendon reflexes in the upper and lower extremities are normal bilaterally.   Toes:    The toes are downgoing bilaterally.   Clonus:    Clonus is absent.    Assessment/Plan:   20 y.o. female here as requested by Maryelizabeth Rowan MD for episodes of loss of consciousness for a month and a half.  She has a past medical history of ADD, anxiety,  back pain, loss of consciousness. First time it happened was when she noticed an eviction notice on her door, always happens when standing, feels hot, dizzy, lightheaded, no urination/defecation/abnormal movements or post-octal state. Likely vasovagal syncope.  Recommend cardiac evaluation with Dr. Darrick Huntsman as clinically warranted Increase fluids and salt - she reports her intake is poor Sit when episode starts MRI of the brain w/wo contrast for seizure focus (seizure less likely) EEG She needs to contact us if the episodes continue or change/worsen Doesn't happen when sitting, but advised not to drive until workup complete  Orders Placed This Encounter  Procedures  . MR BRAIN W WO CONTRAST  . CBC  . Comprehensive metabolic panel  . TSH  . EEG   No orders of the defined types were placed in this encounter.   Cc: Claude Manges, FNP,  Rosie Fate, MD, Maryelizabeth Rowan MD  Naomie Dean,  MD  [facility] [address] [place], [place] [address]  Phone [phone] Fax [phone]

## 2020-08-15 LAB — CBC
Hematocrit: 37.4 % (ref 34.0–46.6)
Hemoglobin: 12.3 g/dL (ref 11.1–15.9)
MCH: 29.1 pg (ref 26.6–33.0)
MCHC: 32.9 g/dL (ref 31.5–35.7)
MCV: 88 fL (ref 79–97)
Platelets: 285 10*3/uL (ref 150–450)
RBC: 4.23 x10E6/uL (ref 3.77–5.28)
RDW: 13.8 % (ref 11.7–15.4)
WBC: 4.2 10*3/uL (ref 3.4–10.8)

## 2020-08-15 LAB — COMPREHENSIVE METABOLIC PANEL
ALT: 12 IU/L (ref 0–32)
AST: 14 IU/L (ref 0–40)
Albumin/Globulin Ratio: 1.6 (ref 1.2–2.2)
Albumin: 4.1 g/dL (ref 3.9–5.0)
Alkaline Phosphatase: 41 IU/L — ABNORMAL LOW (ref 42–106)
BUN/Creatinine Ratio: 26 — ABNORMAL HIGH (ref 9–23)
BUN: 21 mg/dL — ABNORMAL HIGH (ref 6–20)
Bilirubin Total: 0.5 mg/dL (ref 0.0–1.2)
CO2: 22 mmol/L (ref 20–29)
Calcium: 9.2 mg/dL (ref 8.7–10.2)
Chloride: 104 mmol/L (ref 96–106)
Creatinine, Ser: 0.8 mg/dL (ref 0.57–1.00)
GFR calc Af Amer: 124 mL/min/{1.73_m2} (ref >59)
GFR calc non Af Amer: 107 mL/min/{1.73_m2} (ref >59)
Globulin, Total: 2.5 g/dL (ref 1.5–4.5)
Glucose: 99 mg/dL (ref 65–99)
Potassium: 3.7 mmol/L (ref 3.5–5.2)
Sodium: 139 mmol/L (ref 134–144)
Total Protein: 6.6 g/dL (ref 6.0–8.5)

## 2020-08-15 LAB — TSH: TSH: 2.02 u[IU]/mL (ref 0.450–4.500)

## 2020-08-16 ENCOUNTER — Encounter: Payer: Self-pay | Admitting: Neurology

## 2020-08-16 DIAGNOSIS — R55 Syncope and collapse: Secondary | ICD-10-CM | POA: Insufficient documentation

## 2020-08-17 ENCOUNTER — Telehealth: Payer: Self-pay | Admitting: Neurology

## 2020-08-17 ENCOUNTER — Telehealth: Payer: Self-pay | Admitting: *Deleted

## 2020-08-17 ENCOUNTER — Other Ambulatory Visit: Payer: BC Managed Care – PPO

## 2020-08-17 ENCOUNTER — Encounter: Payer: Self-pay | Admitting: Neurology

## 2020-08-17 NOTE — Telephone Encounter (Signed)
-----   Message from Anson Fret, MD sent at 08/15/2020  2:19 PM EST ----- Labs unremarkable thanks

## 2020-08-17 NOTE — Telephone Encounter (Signed)
[employer] no auth via [facility] order sent to [facility]. They will reach out to the patient to schedule.

## 2020-08-17 NOTE — Telephone Encounter (Signed)
Called pt and LVM asking for call back. When she calls back, please let her know her labs are unremarkable, no concerns.

## 2020-08-19 ENCOUNTER — Ambulatory Visit: Payer: No Typology Code available for payment source | Admitting: Neurology

## 2020-08-19 NOTE — Telephone Encounter (Signed)
Pt is going to keep her MRI appt as is. She will call if she is running a fever or having symptoms still when it is time for her MRI.

## 2020-08-19 NOTE — Telephone Encounter (Signed)
Spoke with pt and advised her labs were unremarkable, no concerns. She verbalized understanding and appreciation for the call. She stated she tested positive for COVID yesterday 1/25. She has an MRI scheduled for 08/28/20.

## 2020-08-28 ENCOUNTER — Ambulatory Visit
Admission: RE | Admit: 2020-08-28 | Discharge: 2020-08-28 | Disposition: A | Discharge status: Home / Self Care | Payer: BC Managed Care – PPO | Source: Ambulatory Visit | Attending: Neurology | Admitting: Neurology

## 2020-08-28 ENCOUNTER — Other Ambulatory Visit: Payer: Self-pay

## 2020-08-28 DIAGNOSIS — R55 Syncope and collapse: Secondary | ICD-10-CM

## 2020-08-28 MED ORDER — GADOBENATE DIMEGLUMINE 529 MG/ML IV SOLN
15.0000 mL | Freq: Once | INTRAVENOUS | Status: AC | PRN
  Administered 2020-08-28: 15 mL via INTRAVENOUS

## 2020-08-31 ENCOUNTER — Telehealth: Payer: Self-pay | Admitting: *Deleted

## 2020-08-31 NOTE — Telephone Encounter (Signed)
Called pt and LVM (ok per DPR) advising pt that her MRI brain is unremarkable, no concerns. Left office number for call back if patient has any questions.

## 2020-08-31 NOTE — Telephone Encounter (Signed)
-----   Message from Anson Fret, MD sent at 08/30/2020  6:14 PM EST ----- MRI of the brain unremarkable thanks

## 2022-02-20 IMAGING — CR DG CHEST 2V
2 series · 2 of 2 positions shown · non-contrast
Comparison: None.

CLINICAL DATA: Right-sided chest pain

EXAM:
CHEST - 2 VIEW

[w chest pa]
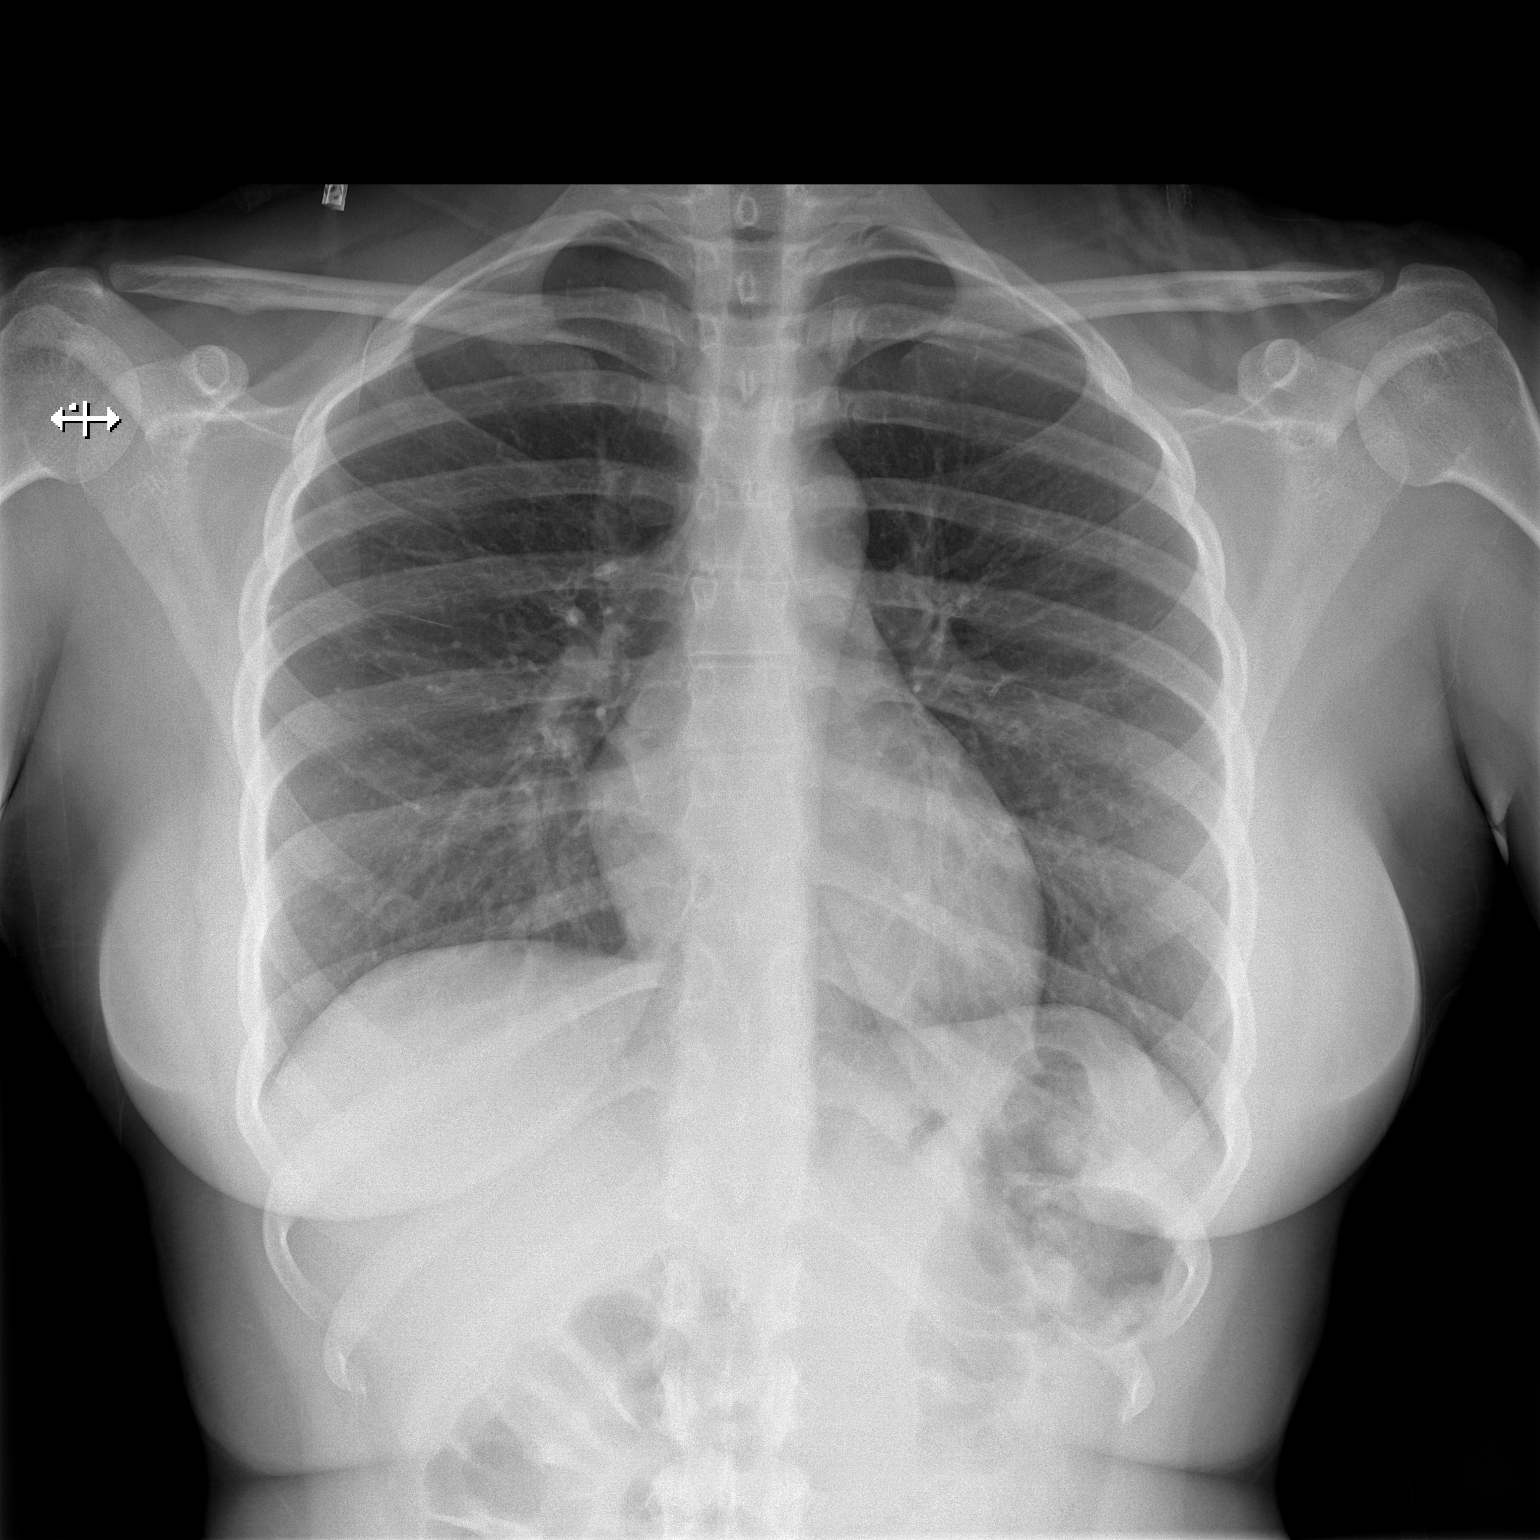

[w chest lat]
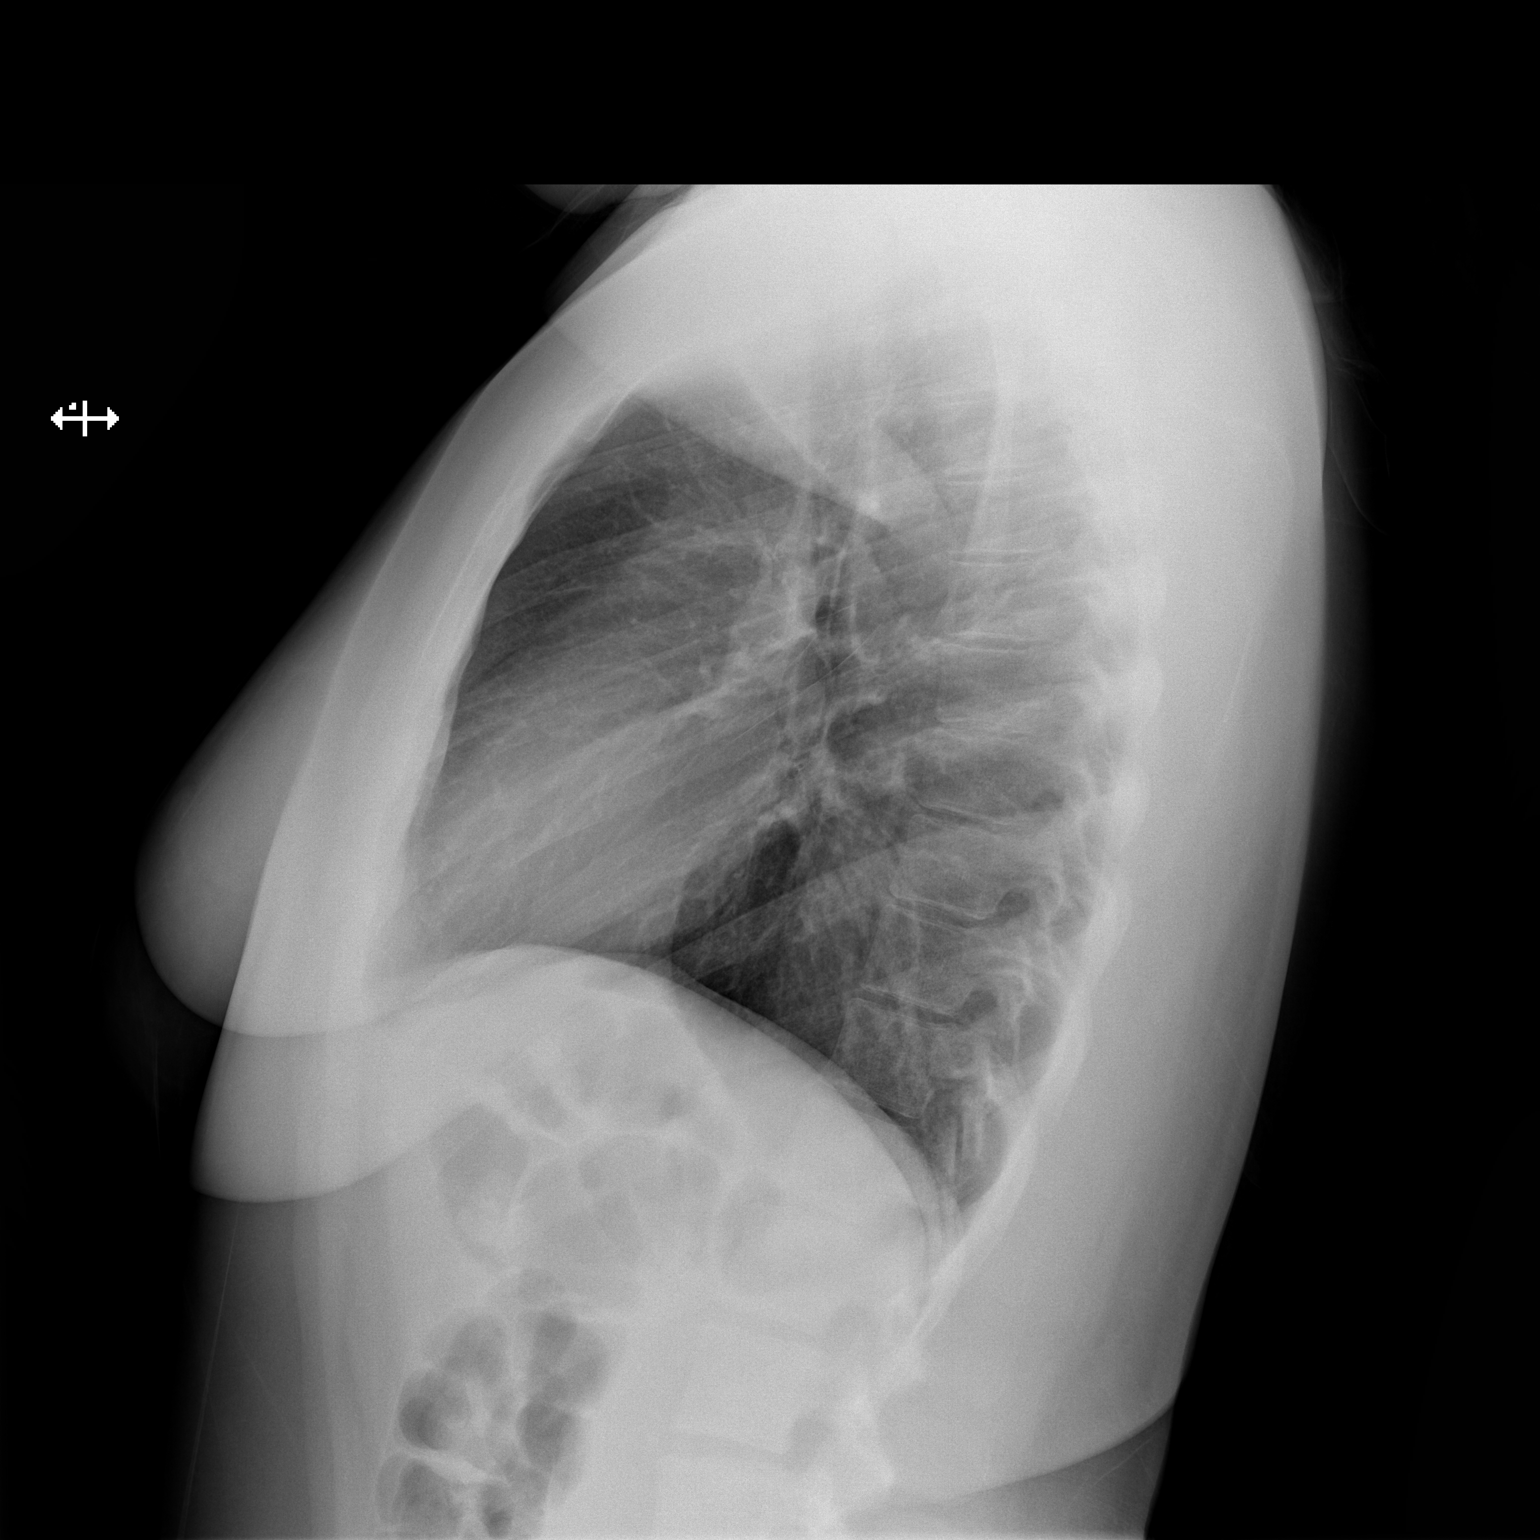

[2 of 2 positions shown; findings below may reference images not displayed]

FINDINGS: The heart size and mediastinal contours are within normal limits.
Both lungs are clear. The visualized skeletal structures are
unremarkable.
IMPRESSION: No active cardiopulmonary disease.
